# Patient Record
Sex: Female | Born: 1990
Health system: Southern US, Community
[De-identification: ages and names within clinical notes are randomized; demographics above are authoritative.]

## PROBLEM LIST (undated history)

## (undated) DIAGNOSIS — N62 Hypertrophy of breast: Secondary | ICD-10-CM

## (undated) HISTORY — PX: COLPOSCOPY: SHX161

## (undated) HISTORY — PX: NO PAST SURGERIES: SHX2092

---
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
## 2010-01-11 ENCOUNTER — Emergency Department (HOSPITAL_BASED_OUTPATIENT_CLINIC_OR_DEPARTMENT_OTHER): Admission: EM | Admit: 2010-01-11 | Discharge: 2009-09-16 | Payer: Self-pay | Admitting: Emergency Medicine

## 2010-02-11 ENCOUNTER — Emergency Department (HOSPITAL_BASED_OUTPATIENT_CLINIC_OR_DEPARTMENT_OTHER)
Admission: EM | Admit: 2010-02-11 | Discharge: 2010-02-11 | Payer: Self-pay | Source: Home / Self Care | Admitting: Emergency Medicine

## 2010-02-19 LAB — RAPID STREP SCREEN (MED CTR MEBANE ONLY): Streptococcus, Group A Screen (Direct): NEGATIVE

## 2010-12-08 ENCOUNTER — Encounter: Payer: Self-pay | Admitting: *Deleted

## 2010-12-08 ENCOUNTER — Emergency Department (HOSPITAL_BASED_OUTPATIENT_CLINIC_OR_DEPARTMENT_OTHER): Payer: Self-pay

## 2010-12-08 ENCOUNTER — Emergency Department (HOSPITAL_BASED_OUTPATIENT_CLINIC_OR_DEPARTMENT_OTHER)
Admission: EM | Admit: 2010-12-08 | Discharge: 2010-12-08 | Disposition: A | Discharge status: Home / Self Care | Payer: Self-pay | Attending: Emergency Medicine | Admitting: Emergency Medicine

## 2010-12-08 DIAGNOSIS — IMO0002 Reserved for concepts with insufficient information to code with codable children: Secondary | ICD-10-CM | POA: Insufficient documentation

## 2010-12-08 DIAGNOSIS — L02411 Cutaneous abscess of right axilla: Secondary | ICD-10-CM

## 2010-12-08 MED ORDER — HYDROCODONE-ACETAMINOPHEN 5-325 MG PO TABS
2.0000 | ORAL_TABLET | Freq: Once | ORAL | Status: AC
  Administered 2010-12-08: 2 via ORAL
  Filled 2010-12-08 (×2): qty 2

## 2010-12-08 MED ORDER — DOXYCYCLINE HYCLATE 100 MG PO CAPS: 100.0000 mg | ORAL_CAPSULE | Freq: Two times a day (BID) | ORAL | Status: AC

## 2010-12-08 MED ORDER — HYDROCODONE-ACETAMINOPHEN 5-325 MG PO TABS: 2.0000 | ORAL_TABLET | ORAL | Status: AC | PRN

## 2010-12-08 NOTE — ED Notes (Signed)
C/o boil under right arm x 2 days- states has started to drain

## 2010-12-12 NOTE — ED Provider Notes (Signed)
History     CSN: [id number] Arrival date & time: 12/08/2010  7:59 PM   First MD Initiated Contact with Patient 12/08/10 2008      Chief Complaint  Patient presents with  . Recurrent Skin Infections    (Consider location/radiation/quality/duration/timing/severity/associated sxs/prior treatment) Patient is a 20 y.o. female presenting with abscess. The history is provided by the patient. No language interpreter was used.  Abscess  This is a new problem. The current episode started more than one week ago. The onset was gradual. The problem occurs continuously. The problem has been gradually improving. The abscess is characterized by redness. The patient was exposed to OTC medications. The abscess first occurred at home.  Pt complains of a draining abscess.    History reviewed. No pertinent past medical history.  History reviewed. No pertinent past surgical history.  No family history on file.  History  Substance Use Topics  . Smoking status: Never Smoker   . Smokeless tobacco: Never Used  . Alcohol Use: Yes     occasional    OB History    Grav Para Term Preterm Abortions TAB SAB Ect Mult Living                  Review of Systems  Skin: Positive for wound.  All other systems reviewed and are negative.    Allergies  Review of patient's allergies indicates no known allergies.  Home Medications   Current Outpatient Rx  Name Route Sig Dispense Refill  . ACETAMINOPHEN 500 MG PO TABS Oral Take 1,000 mg by mouth once as needed. For pain     . IBUPROFEN 200 MG PO TABS Oral Take 200 mg by mouth once as needed. For pain     . DOXYCYCLINE HYCLATE 100 MG PO CAPS Oral Take 1 capsule (100 mg total) by mouth 2 (two) times daily. 20 capsule 0  . HYDROCODONE-ACETAMINOPHEN 5-325 MG PO TABS Oral Take 2 tablets by mouth every 4 (four) hours as needed for pain. 10 tablet 0    BP 93/65  Pulse 89  Temp(Src) 98.6 F (37 C) (Oral)  Resp 20  Ht 5\' 7"  (1.702 m)  Wt 170 lb (77.111 kg)   BMI 26.63 kg/m2  SpO2 100%  LMP 11/24/2010  Physical Exam  Nursing note and vitals reviewed. Constitutional: She is oriented to person, place, and time. She appears well-developed and well-nourished.  HENT:  Head: Normocephalic and atraumatic.  Right Ear: External ear normal.  Left Ear: External ear normal.  Mouth/Throat: Oropharynx is clear and moist.  Eyes: Conjunctivae and EOM are normal. Pupils are equal, round, and reactive to light.  Neck: Normal range of motion. Neck supple.  Cardiovascular: Normal rate and regular rhythm.   Pulmonary/Chest: Effort normal.  Musculoskeletal: Normal range of motion.  Neurological: She is alert and oriented to person, place, and time. She has normal reflexes.  Skin: There is erythema.       Draining abscess,    Psychiatric: She has a normal mood and affect.    ED Course  Procedures (including critical care time)  Labs Reviewed - No data to display No results found.   1. Abscess of axilla, right       MDM          Langston Masker, [place] 12/12/10 1859

## 2010-12-12 NOTE — ED Provider Notes (Signed)
Medical screening examination/treatment/procedure(s) were performed by non-physician practitioner and as supervising physician I was immediately available for consultation/collaboration.   Glynn Octave, MD 12/12/10 [phone]

## 2011-02-02 ENCOUNTER — Emergency Department (HOSPITAL_BASED_OUTPATIENT_CLINIC_OR_DEPARTMENT_OTHER)
Admission: EM | Admit: 2011-02-02 | Discharge: 2011-02-02 | Disposition: A | Discharge status: Home / Self Care | Payer: Self-pay | Attending: Emergency Medicine | Admitting: Emergency Medicine

## 2011-02-02 ENCOUNTER — Encounter (HOSPITAL_BASED_OUTPATIENT_CLINIC_OR_DEPARTMENT_OTHER): Payer: Self-pay | Admitting: *Deleted

## 2011-02-02 ENCOUNTER — Emergency Department (INDEPENDENT_AMBULATORY_CARE_PROVIDER_SITE_OTHER): Payer: Self-pay

## 2011-02-02 DIAGNOSIS — IMO0002 Reserved for concepts with insufficient information to code with codable children: Secondary | ICD-10-CM | POA: Insufficient documentation

## 2011-02-02 DIAGNOSIS — T148XXA Other injury of unspecified body region, initial encounter: Secondary | ICD-10-CM

## 2011-02-02 DIAGNOSIS — X58XXXA Exposure to other specified factors, initial encounter: Secondary | ICD-10-CM | POA: Insufficient documentation

## 2011-02-02 DIAGNOSIS — W19XXXA Unspecified fall, initial encounter: Secondary | ICD-10-CM

## 2011-02-02 DIAGNOSIS — M25569 Pain in unspecified knee: Secondary | ICD-10-CM

## 2011-02-02 LAB — PREGNANCY, URINE: Preg Test, Ur: NEGATIVE

## 2011-02-02 MED ORDER — HYDROCODONE-ACETAMINOPHEN 5-500 MG PO TABS: 1.0000 | ORAL_TABLET | Freq: Four times a day (QID) | ORAL | Status: AC | PRN

## 2011-02-02 NOTE — ED Provider Notes (Signed)
History     CSN: [id number]  Arrival date & time 02/02/11  1350   First MD Initiated Contact with Patient 02/02/11 1526      Chief Complaint  Patient presents with  . Knee Injury    (Consider location/radiation/quality/duration/timing/severity/associated sxs/prior treatment) Patient is a 20 y.o. female presenting with knee pain. The history is provided by the patient. No language interpreter was used.  Knee Pain This is a new problem. The current episode started in the past 7 days. The problem occurs constantly. The problem has been unchanged. The symptoms are aggravated by bending. She has tried nothing for the symptoms.    History reviewed. No pertinent past medical history.  History reviewed. No pertinent past surgical history.  No family history on file.  History  Substance Use Topics  . Smoking status: Never Smoker   . Smokeless tobacco: Never Used  . Alcohol Use: Yes     occasional    OB History    Grav Para Term Preterm Abortions TAB SAB Ect Mult Living                  Review of Systems  All other systems reviewed and are negative.    Allergies  Review of patient's allergies indicates no known allergies.  Home Medications   Current Outpatient Rx  Name Route Sig Dispense Refill  . ACETAMINOPHEN 500 MG PO TABS Oral Take 1,000 mg by mouth once as needed. For pain     . IBUPROFEN 200 MG PO TABS Oral Take 200 mg by mouth once as needed. For pain       BP 123/79  Pulse 85  Temp(Src) 98 F (36.7 C) (Oral)  Resp 18  Ht 5\' 7"  (1.702 m)  Wt 170 lb (77.111 kg)  BMI 26.63 kg/m2  SpO2 100%  LMP 01/19/2011  Physical Exam  Nursing note and vitals reviewed. Constitutional: She is oriented to person, place, and time. She appears well-developed and well-nourished.  HENT:  Head: Normocephalic and atraumatic.  Neck: Neck supple.  Cardiovascular: Normal rate and regular rhythm.   Pulmonary/Chest: Effort normal and breath sounds normal.  Musculoskeletal:  Normal range of motion.       Pt has full rom  Neurological: She is alert and oriented to person, place, and time. Coordination normal.  Skin:       Pt has an abrasion to the right knee    ED Course  Procedures (including critical care time)   Labs Reviewed  PREGNANCY, URINE   Dg Knee Complete 4 Views Right  02/02/2011  *RADIOLOGY REPORT*  Clinical Data: Right knee pain post fall  RIGHT KNEE - COMPLETE 4+ VIEW  Comparison: None.  Findings: Four views of the right knee submitted.  No acute fracture or subluxation.  No radiopaque foreign body.  IMPRESSION: No acute fracture or subluxation.  Original Report Authenticated By: Natasha Mead, M.D.     1. Knee pain   2. Abrasion       MDM  No acute findings:will treat symptomatically        Teressa Lower, NP 02/02/11 [phone]

## 2011-02-02 NOTE — ED Notes (Signed)
Pt states she slipped down a couple steps and injured her right knee. Abrasion to same.

## 2011-02-04 NOTE — ED Provider Notes (Signed)
Medical screening examination/treatment/procedure(s) were performed by non-physician practitioner and as supervising physician I was immediately available for consultation/collaboration. Needham Biggins Y.   Cranford Blessinger Y. Caven Perine, MD 02/04/11 [phone] 

## 2011-06-01 ENCOUNTER — Emergency Department (HOSPITAL_BASED_OUTPATIENT_CLINIC_OR_DEPARTMENT_OTHER)
Admission: EM | Admit: 2011-06-01 | Discharge: 2011-06-01 | Disposition: A | Discharge status: Home / Self Care | Payer: Self-pay | Attending: Emergency Medicine | Admitting: Emergency Medicine

## 2011-06-01 ENCOUNTER — Encounter (HOSPITAL_BASED_OUTPATIENT_CLINIC_OR_DEPARTMENT_OTHER): Payer: Self-pay | Admitting: Emergency Medicine

## 2011-06-01 ENCOUNTER — Emergency Department (INDEPENDENT_AMBULATORY_CARE_PROVIDER_SITE_OTHER): Payer: Self-pay

## 2011-06-01 DIAGNOSIS — IMO0002 Reserved for concepts with insufficient information to code with codable children: Secondary | ICD-10-CM | POA: Insufficient documentation

## 2011-06-01 DIAGNOSIS — R079 Chest pain, unspecified: Secondary | ICD-10-CM | POA: Insufficient documentation

## 2011-06-01 DIAGNOSIS — T148XXA Other injury of unspecified body region, initial encounter: Secondary | ICD-10-CM

## 2011-06-01 DIAGNOSIS — R0781 Pleurodynia: Secondary | ICD-10-CM

## 2011-06-01 MED ORDER — HYDROCODONE-ACETAMINOPHEN 5-500 MG PO TABS: 1.0000 | ORAL_TABLET | Freq: Four times a day (QID) | ORAL | Status: AC | PRN

## 2011-06-01 MED ORDER — HYDROCODONE-ACETAMINOPHEN 5-325 MG PO TABS
1.0000 | ORAL_TABLET | Freq: Once | ORAL | Status: AC
  Administered 2011-06-01: 1 via ORAL
  Filled 2011-06-01: qty 1

## 2011-06-01 NOTE — ED Provider Notes (Signed)
Medical screening examination/treatment/procedure(s) were performed by non-physician practitioner and as supervising physician I was immediately available for consultation/collaboration.  Doug Sou, MD 06/01/11 [phone]
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
## 2017-03-28 ENCOUNTER — Other Ambulatory Visit: Payer: Self-pay

## 2017-03-28 ENCOUNTER — Emergency Department (HOSPITAL_BASED_OUTPATIENT_CLINIC_OR_DEPARTMENT_OTHER): Payer: Self-pay

## 2017-03-28 ENCOUNTER — Encounter (HOSPITAL_BASED_OUTPATIENT_CLINIC_OR_DEPARTMENT_OTHER): Payer: Self-pay | Admitting: *Deleted

## 2017-03-28 ENCOUNTER — Emergency Department (HOSPITAL_BASED_OUTPATIENT_CLINIC_OR_DEPARTMENT_OTHER)
Admission: EM | Admit: 2017-03-28 | Discharge: 2017-03-28 | Disposition: A | Discharge status: Home / Self Care | Payer: Self-pay | Attending: Emergency Medicine | Admitting: Emergency Medicine

## 2017-03-28 DIAGNOSIS — H18891 Other specified disorders of cornea, right eye: Secondary | ICD-10-CM | POA: Insufficient documentation

## 2017-03-28 DIAGNOSIS — M791 Myalgia, unspecified site: Secondary | ICD-10-CM | POA: Insufficient documentation

## 2017-03-28 DIAGNOSIS — R51 Headache: Secondary | ICD-10-CM | POA: Insufficient documentation

## 2017-03-28 DIAGNOSIS — S62664A Nondisplaced fracture of distal phalanx of right ring finger, initial encounter for closed fracture: Secondary | ICD-10-CM | POA: Insufficient documentation

## 2017-03-28 DIAGNOSIS — Y9389 Activity, other specified: Secondary | ICD-10-CM | POA: Insufficient documentation

## 2017-03-28 DIAGNOSIS — Y998 Other external cause status: Secondary | ICD-10-CM | POA: Insufficient documentation

## 2017-03-28 DIAGNOSIS — Y929 Unspecified place or not applicable: Secondary | ICD-10-CM | POA: Insufficient documentation

## 2017-03-28 LAB — PREGNANCY, URINE: Preg Test, Ur: NEGATIVE

## 2017-03-28 MED ORDER — ERYTHROMYCIN 5 MG/GM OP OINT: TOPICAL_OINTMENT | OPHTHALMIC | 0 refills | Status: DC

## 2017-03-28 MED ORDER — CYCLOBENZAPRINE HCL 10 MG PO TABS: 10.0000 mg | ORAL_TABLET | Freq: Two times a day (BID) | ORAL | 0 refills | Status: DC | PRN

## 2017-03-28 MED ORDER — TETRACAINE HCL 0.5 % OP SOLN
2.0000 [drp] | Freq: Once | OPHTHALMIC | Status: AC
  Administered 2017-03-28: 2 [drp] via OPHTHALMIC
  Filled 2017-03-28: qty 4

## 2017-03-28 MED ORDER — FLUORESCEIN SODIUM 1 MG OP STRP
1.0000 | ORAL_STRIP | Freq: Once | OPHTHALMIC | Status: AC
  Administered 2017-03-28: 1 via OPHTHALMIC
  Filled 2017-03-28: qty 1

## 2017-03-28 MED ORDER — KETOROLAC TROMETHAMINE 60 MG/2ML IM SOLN
60.0000 mg | Freq: Once | INTRAMUSCULAR | Status: AC
  Administered 2017-03-28: 60 mg via INTRAMUSCULAR
  Filled 2017-03-28: qty 2

## 2017-03-28 NOTE — ED Notes (Signed)
ED Provider at bedside. 

## 2017-03-28 NOTE — ED Notes (Signed)
Patient transported to X-ray 

## 2017-03-28 NOTE — Discharge Instructions (Addendum)
Wear your finger splint for 3-4 weeks, recommend ice and elevation

## 2017-03-28 NOTE — ED Triage Notes (Signed)
Generalized body aches x 2 days
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
## 2019-02-22 ENCOUNTER — Other Ambulatory Visit: Payer: Self-pay

## 2019-02-22 ENCOUNTER — Emergency Department (HOSPITAL_BASED_OUTPATIENT_CLINIC_OR_DEPARTMENT_OTHER): Admission: EM | Admit: 2019-02-22 | Discharge: 2019-02-22 | Discharge status: Against Medical Advice | Payer: Self-pay

## 2019-03-17 MED FILL — PREVIDENT 5000 SENSITIVE PA: 1.1-5 | 30 days supply | Qty: 100 | Fill #0

## 2020-07-10 ENCOUNTER — Other Ambulatory Visit (HOSPITAL_BASED_OUTPATIENT_CLINIC_OR_DEPARTMENT_OTHER): Payer: Self-pay

## 2020-08-14 ENCOUNTER — Other Ambulatory Visit (HOSPITAL_BASED_OUTPATIENT_CLINIC_OR_DEPARTMENT_OTHER): Payer: Self-pay

## 2020-08-14 MED ORDER — PHENTERMINE HCL 15 MG PO CAPS
ORAL_CAPSULE | ORAL | 0 refills | Status: DC
  Filled 2020-08-14: qty 30, 30d supply, fill #0

## 2021-04-29 ENCOUNTER — Emergency Department (HOSPITAL_BASED_OUTPATIENT_CLINIC_OR_DEPARTMENT_OTHER): Payer: Self-pay

## 2021-04-29 ENCOUNTER — Emergency Department (HOSPITAL_BASED_OUTPATIENT_CLINIC_OR_DEPARTMENT_OTHER)
Admission: EM | Admit: 2021-04-29 | Discharge: 2021-04-29 | Disposition: A | Discharge status: Home / Self Care | Payer: Self-pay | Attending: Emergency Medicine | Admitting: Emergency Medicine

## 2021-04-29 ENCOUNTER — Other Ambulatory Visit: Payer: Self-pay

## 2021-04-29 DIAGNOSIS — R109 Unspecified abdominal pain: Secondary | ICD-10-CM | POA: Insufficient documentation

## 2021-04-29 DIAGNOSIS — R112 Nausea with vomiting, unspecified: Secondary | ICD-10-CM | POA: Insufficient documentation

## 2021-04-29 DIAGNOSIS — D72829 Elevated white blood cell count, unspecified: Secondary | ICD-10-CM | POA: Insufficient documentation

## 2021-04-29 LAB — TROPONIN I (HIGH SENSITIVITY)
Troponin I (High Sensitivity): <2 ng/L (ref <18)
Troponin I (High Sensitivity): <2 ng/L (ref <18)

## 2021-04-29 LAB — COMPREHENSIVE METABOLIC PANEL
ALT: 15 U/L (ref 0–44)
AST: 23 U/L (ref 15–41)
Albumin: 4.3 g/dL (ref 3.5–5.0)
Alkaline Phosphatase: 36 U/L — ABNORMAL LOW (ref 38–126)
Anion gap: 6 (ref 5–15)
BUN: 11 mg/dL (ref 6–20)
CO2: 23 mmol/L (ref 22–32)
Calcium: 9.2 mg/dL (ref 8.9–10.3)
Chloride: 109 mmol/L (ref 98–111)
Creatinine, Ser: 0.76 mg/dL (ref 0.44–1.00)
GFR, Estimated: >60 mL/min (ref >60)
Glucose, Bld: 113 mg/dL — ABNORMAL HIGH (ref 70–99)
Potassium: 3.9 mmol/L (ref 3.5–5.1)
Sodium: 138 mmol/L (ref 135–145)
Total Bilirubin: 0.4 mg/dL (ref 0.3–1.2)
Total Protein: 7.7 g/dL (ref 6.5–8.1)

## 2021-04-29 LAB — CBC
HCT: 38.2 % (ref 36.0–46.0)
Hemoglobin: 13.4 g/dL (ref 12.0–15.0)
MCH: 32.6 pg (ref 26.0–34.0)
MCHC: 35.1 g/dL (ref 30.0–36.0)
MCV: 92.9 fL (ref 80.0–100.0)
Platelets: 214 10*3/uL (ref 150–400)
RBC: 4.11 MIL/uL (ref 3.87–5.11)
RDW: 11.9 % (ref 11.5–15.5)
WBC: 11.9 10*3/uL — ABNORMAL HIGH (ref 4.0–10.5)
nRBC: 0 % (ref 0.0–0.2)

## 2021-04-29 LAB — URINALYSIS, ROUTINE W REFLEX MICROSCOPIC
Bilirubin Urine: NEGATIVE
Glucose, UA: NEGATIVE mg/dL
Ketones, ur: NEGATIVE mg/dL
Leukocytes,Ua: NEGATIVE
Nitrite: NEGATIVE
Protein, ur: NEGATIVE mg/dL
Specific Gravity, Urine: >=1.03 (ref 1.005–1.030)
pH: 5.5 (ref 5.0–8.0)

## 2021-04-29 LAB — URINALYSIS, MICROSCOPIC (REFLEX)

## 2021-04-29 LAB — PREGNANCY, URINE: Preg Test, Ur: NEGATIVE

## 2021-04-29 LAB — LIPASE, BLOOD: Lipase: 24 U/L (ref 11–51)

## 2021-04-29 MED ORDER — MORPHINE SULFATE (PF) 4 MG/ML IV SOLN
4.0000 mg | Freq: Once | INTRAVENOUS | Status: AC
  Administered 2021-04-29: 4 mg via INTRAVENOUS
  Filled 2021-04-29: qty 1

## 2021-04-29 NOTE — Discharge Instructions (Signed)
The test today in the ED were reassuring.  No signs of acute infection.  No signs of heart problems.  No signs to suggest urinary tract infection or gallstones.  Monitor for signs of fever vomiting or other concerning symptoms.  Return as needed ?

## 2021-04-29 NOTE — ED Provider Notes (Signed)
?[facility] EMERGENCY DEPARTMENT ?Provider Note ? ? ?CSN: [id number] ?Arrival date & time: 04/29/21  [id number] ? ?  ? ?History ? ?Chief Complaint  ?Patient presents with  ? Back Pain  ? ? ?Madison Moss is a 31 y.o. female. ? ? ?Back Pain ? ? Pt complains of bilateral flank pain that started this morning.  Pain is sharp below bilateral breasts and goes to the flank.    Did have nausea and vomiting with the pain.  Nothing seems to make the pain better or worse.  No cough.  No abdominal pain.  No dysuria.  No history of similar symptoms in the past. ? ?Home Medications ?Prior to Admission medications   ?Medication Sig Start Date End Date Taking? Authorizing Provider  ?acetaminophen (TYLENOL) 500 MG tablet Take 1,000 mg by mouth once as needed. For pain     [provider]  ?cyclobenzaprine (FLEXERIL) 10 MG tablet Take 1 tablet (10 mg total) by mouth 2 (two) times daily as needed for muscle spasms. 03/28/17   Alvira MondaySchlossman, Erin, MD  ?erythromycin ophthalmic ointment Place a 1/2 inch ribbon of ointment into the lower eyelid four times daily until symptoms resolve or for one week. 03/28/17   Alvira MondaySchlossman, Erin, MD  ?ibuprofen (ADVIL,MOTRIN) 200 MG tablet Take 200 mg by mouth once as needed. For pain     [provider]  ?phentermine 15 MG capsule Take 1 (one) Capsule by mouth daily as directed 08/14/20     ?   ? ?Allergies    ?Patient has no known allergies.   ? ?Review of Systems   ?Review of Systems  ?Musculoskeletal:  Positive for back pain.  ?All other systems reviewed and are negative. ? ?Physical Exam ?Updated Vital Signs ?BP 116/82   Pulse 72   Temp 98.3 ?F (36.8 ?C)   Resp 18   LMP 04/04/2021   SpO2 100%  ?Physical Exam ?Vitals and nursing note reviewed.  ?Constitutional:   ?   General: She is not in acute distress. ?   Appearance: She is well-developed.  ?HENT:  ?   Head: Normocephalic and atraumatic.  ?   Right Ear: External ear normal.  ?   Left Ear: External ear normal.  ?Eyes:  ?   General:  No scleral icterus.    ?   Right eye: No discharge.     ?   Left eye: No discharge.  ?   Conjunctiva/sclera: Conjunctivae normal.  ?Neck:  ?   Trachea: No tracheal deviation.  ?Cardiovascular:  ?   Rate and Rhythm: Normal rate and regular rhythm.  ?Pulmonary:  ?   Effort: Pulmonary effort is normal. No respiratory distress.  ?   Breath sounds: Normal breath sounds. No stridor. No wheezing or rales.  ?Abdominal:  ?   General: Bowel sounds are normal. There is no distension.  ?   Palpations: Abdomen is soft.  ?   Tenderness: There is no abdominal tenderness. There is no right CVA tenderness, left CVA tenderness, guarding or rebound.  ?Musculoskeletal:     ?   General: No tenderness or deformity.  ?   Cervical back: Neck supple.  ?Skin: ?   General: Skin is warm and dry.  ?   Findings: No rash.  ?Neurological:  ?   General: No focal deficit present.  ?   Mental Status: She is alert.  ?   Cranial Nerves: No cranial nerve deficit (no facial droop, extraocular movements intact, no slurred speech).  ?  Sensory: No sensory deficit.  ?   Motor: No abnormal muscle tone or seizure activity.  ?   Coordination: Coordination normal.  ?Psychiatric:     ?   Mood and Affect: Mood normal.  ? ? ?ED Results / Procedures / Treatments   ?Labs ?(all labs ordered are listed, but only abnormal results are displayed) ?Labs Reviewed  ?URINALYSIS, ROUTINE W REFLEX MICROSCOPIC - Abnormal; Notable for the following components:  ?    Result Value  ? Hgb urine dipstick TRACE (*)   ? All other components within normal limits  ?CBC - Abnormal; Notable for the following components:  ? WBC 11.9 (*)   ? All other components within normal limits  ?COMPREHENSIVE METABOLIC PANEL - Abnormal; Notable for the following components:  ? Glucose, Bld 113 (*)   ? Alkaline Phosphatase 36 (*)   ? All other components within normal limits  ?URINALYSIS, MICROSCOPIC (REFLEX) - Abnormal; Notable for the following components:  ? Bacteria, UA MANY (*)   ? All other  components within normal limits  ?PREGNANCY, URINE  ?LIPASE, BLOOD  ?TROPONIN I (HIGH SENSITIVITY)  ?TROPONIN I (HIGH SENSITIVITY)  ? ? ?EKG ?None ? ?Radiology ?DG Chest 2 View ? ?Result Date: 04/29/2021 ?CLINICAL DATA:  Bilateral flank pain EXAM: CHEST - 2 VIEW COMPARISON:  06/01/2011 FINDINGS: Normal heart size and mediastinal contours. No acute infiltrate or edema. No effusion or pneumothorax. No acute osseous findings. IMPRESSION: Negative chest. Electronically Signed   By: Tiburcio Pea M.D.   On: 04/29/2021 08:02  ? ?US Abdomen Limited RUQ (LIVER/GB) ? ?Result Date: 04/29/2021 ?CLINICAL DATA:  Flank pain EXAM: ULTRASOUND ABDOMEN LIMITED RIGHT UPPER QUADRANT COMPARISON:  None. FINDINGS: Gallbladder: No gallstones or wall thickening visualized. No sonographic Murphy sign noted by sonographer. Common bile duct: Diameter: 6 mm proximally Liver: No focal lesion identified. Within normal limits in parenchymal echogenicity. Portal vein is patent on color Doppler imaging with normal direction of blood flow towards the liver. Other: None. IMPRESSION: No definite acute abnormality identified. Electronically Signed   By: Jannifer Hick M.D.   On: 04/29/2021 11:12   ? ?Procedures ?Procedures  ? ? ?Medications Ordered in ED ?Medications  ?morphine (PF) 4 MG/ML injection 4 mg (4 mg Intravenous Given 04/29/21 0730)  ? ? ?ED Course/ Medical Decision Making/ A&P ?Clinical Course as of 04/30/21 0709  ?Wynelle Link Apr 29, 2021  ?0717 Urinalysis, Routine w reflex microscopic Urine, Clean Catch(!) [JK]  ?6195 Many bacteria but negative nitrite and leukocyte esterase.  Not definitive for infection [JK]  ?0720 Pregnancy, urine ?Get his [JK]  ?0932 DG Chest 2 View ?Chest x-ray images and radiology report reviewed.  No acute findings [JK]  ?6712 CBC(!) ?White blood cell count slightly increased [JK]  ?4580 Comprehensive metabolic panel(!) ?Normal [JK]  ?9983 Lipase, blood ?Normal [JK]  ?1124 US Abdomen Limited RUQ (LIVER/GB) ?Ultrasound  reviewed.  No acute findings [JK]  ?  ?Clinical Course User Index ?[JK] Linwood Dibbles, MD  ? ?                        ?Medical Decision Making ?Amount and/or Complexity of Data Reviewed ?Labs: ordered. Decision-making details documented in ED Course. ?Radiology: ordered. Decision-making details documented in ED Course. ? ?Risk ?Prescription drug management. ? ? ?Patient presented with complaints of flank pain.  Symptoms concerning for the possibility of biliary colic, ureteral colic, atypical for ACS cardiac etiology. ? ?ED work-up reassuring.  No evidence of pneumonia pneumothorax.  Findings not suggestive of hepatitis or pancreatitis.  Doubt acute cardiac etiology.  Ultrasound performed and no evidence of cholecystitis or cholelithiasis.  Patient states she passed flatus while she is here and symptoms subsequently resolved. ? ?Evaluation and diagnostic testing in the emergency department does not suggest an emergent condition requiring admission or immediate intervention beyond what has been performed at this time.  The patient is safe for discharge and has been instructed to return immediately for worsening symptoms, change in symptoms or any other concerns. ? ? ? ? ? ? ? ? ? ?Final Clinical Impression(s) / ED Diagnoses ?Final diagnoses:  ?Flank pain  ? ? ?Rx / DC Orders ?ED Discharge Orders   ? ? None  ? ?  ? ? ?  ?Linwood Dibbles, MD ?04/30/21 [phone] ? ?

## 2021-04-29 NOTE — ED Triage Notes (Addendum)
Pt reports mid back/ flank pain radiating across ribs and epigastric area. She says it feels like gas pain. Did have episode of nausea and vomiting earlier. Nothing makes pain worse or better. She says constant. ?

## 2022-07-26 IMAGING — US US ABDOMEN LIMITED
1 series · 14 of 25 positions shown · non-contrast
Comparison: None.

CLINICAL DATA: Flank pain

EXAM:
ULTRASOUND ABDOMEN LIMITED RIGHT UPPER QUADRANT

[Series 1: us abdomen limited · 14 of 73 slices shown]
[im 1/73]
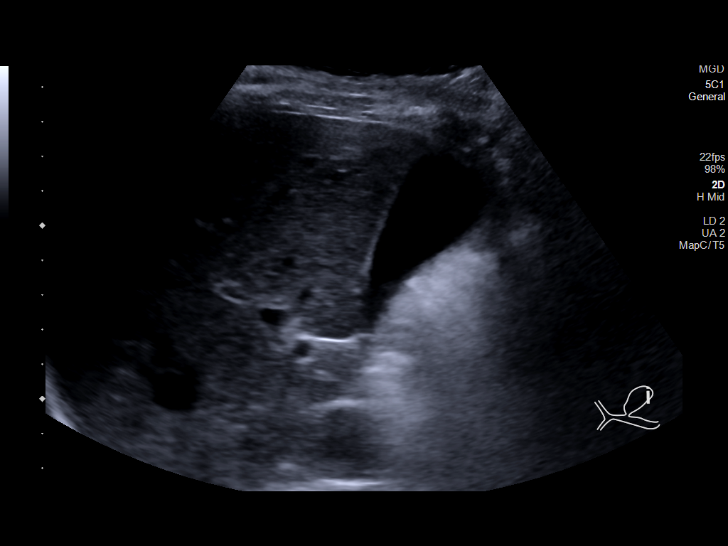
[im 7/73]
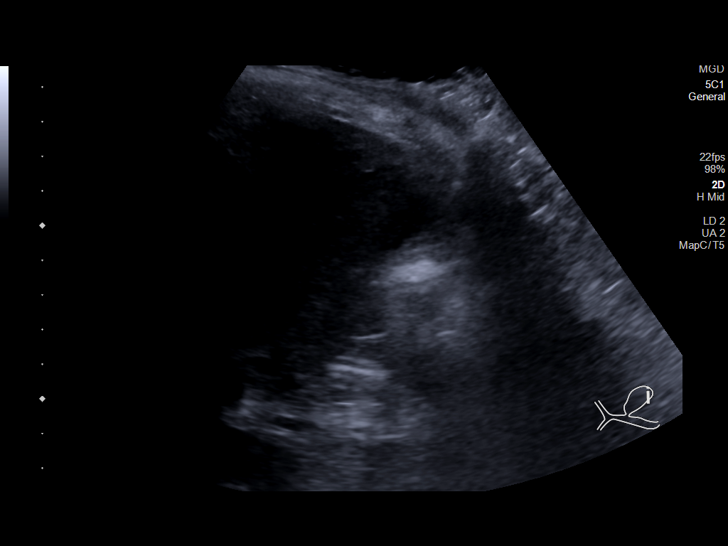
[im 13/73]
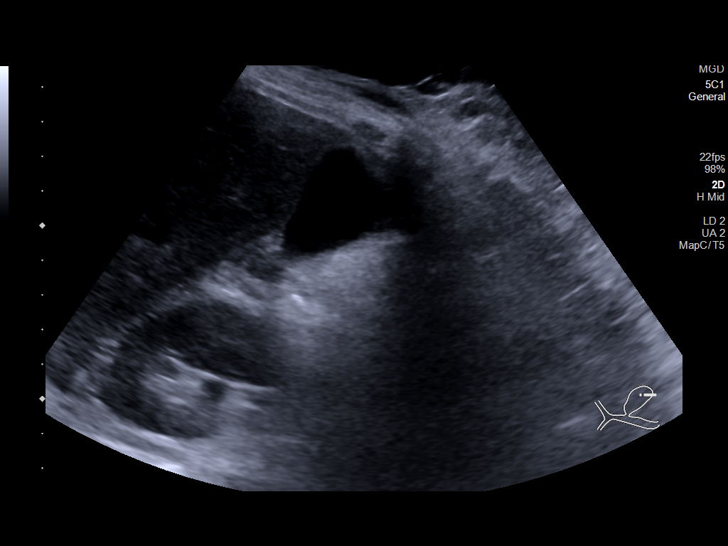
[im 19/73]
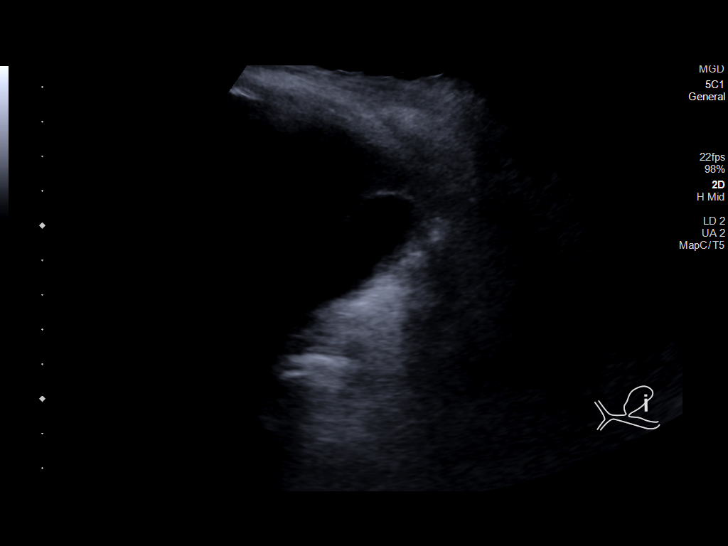
[im 25/73]
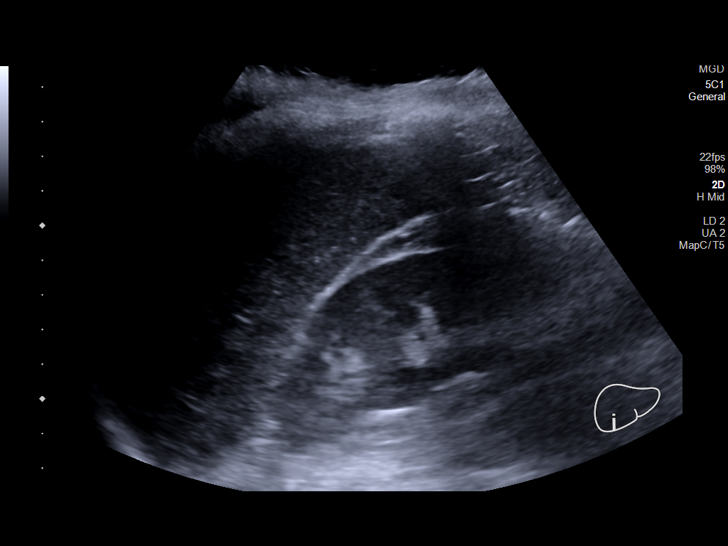
[im 28/73]
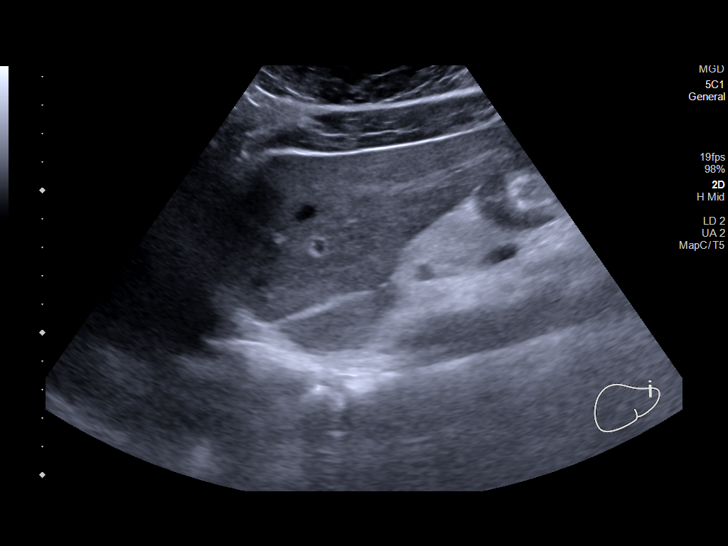
[im 34/73]
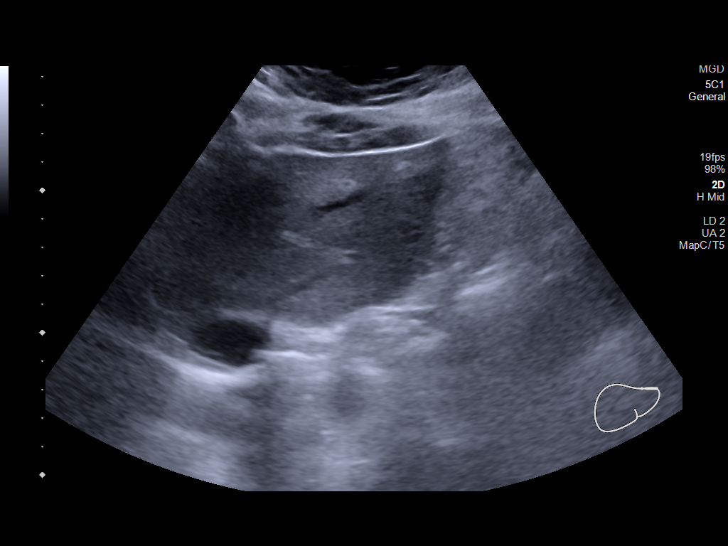
[im 40/73]
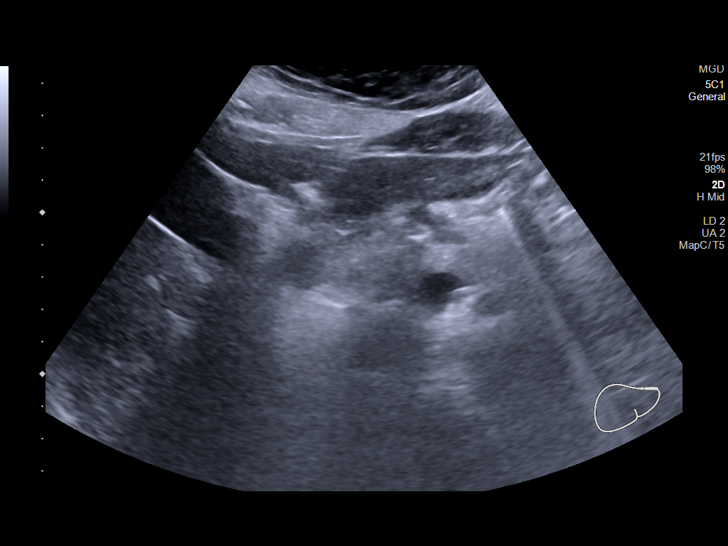
[im 46/73]
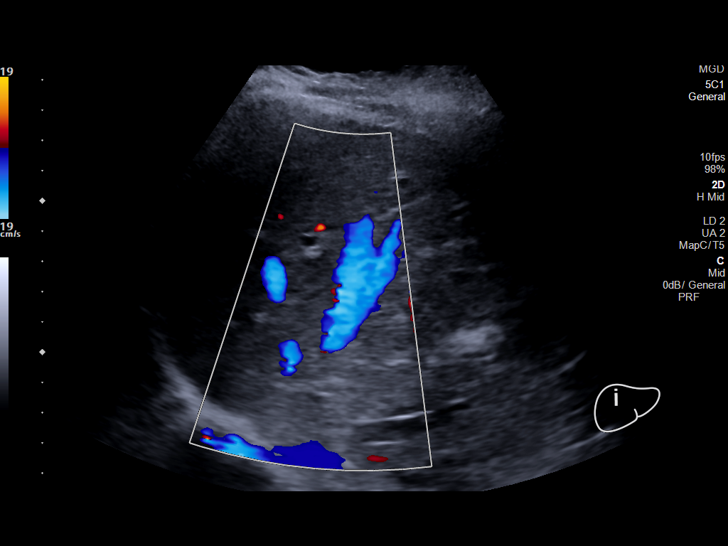
[im 49/73]
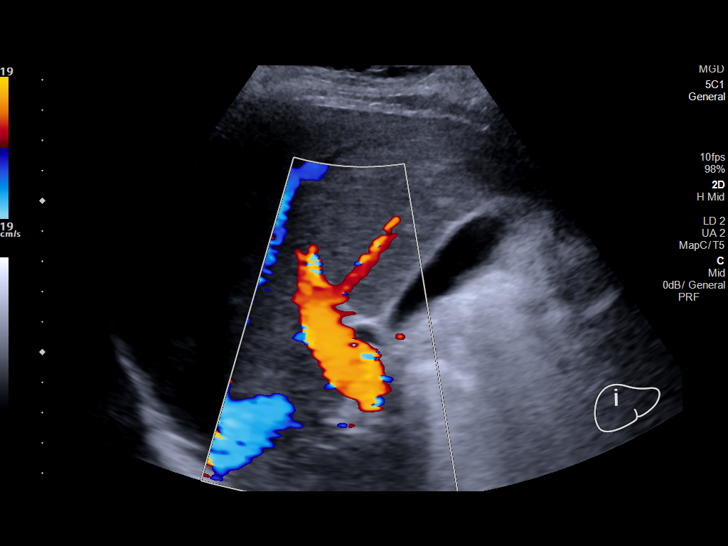
[im 55/73]
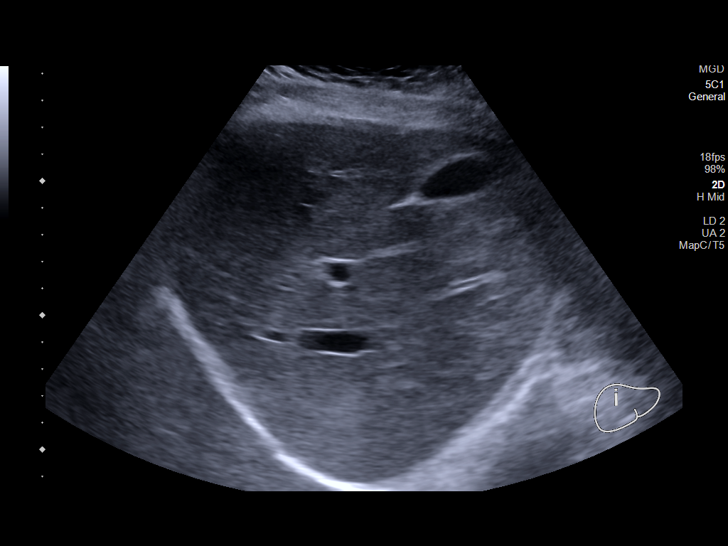
[im 61/73]
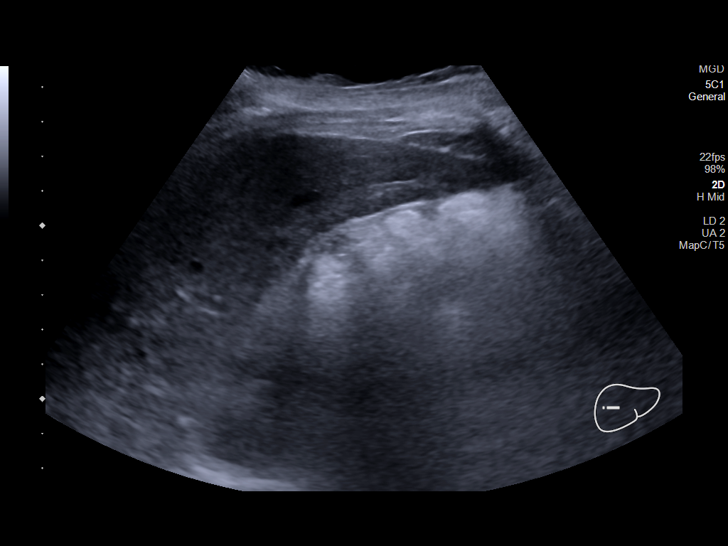
[im 67/73]
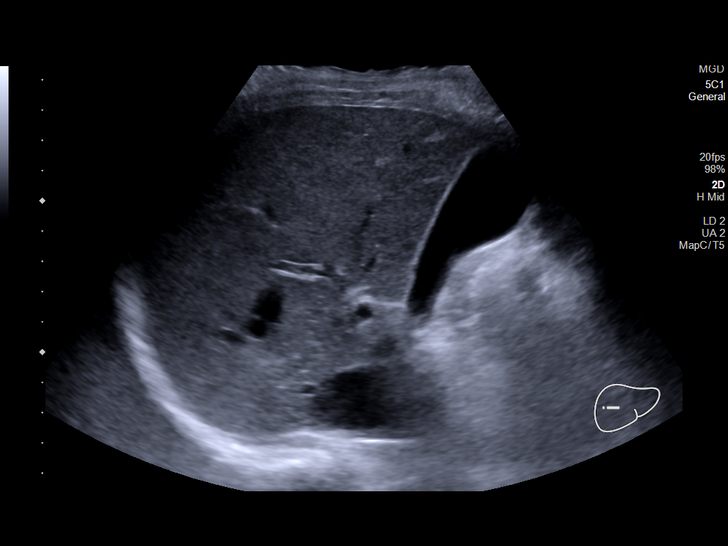
[im 73/73]
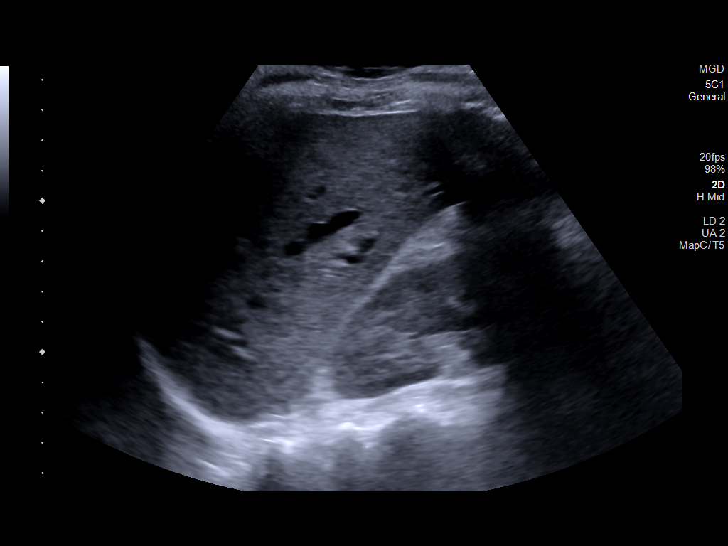

[14 of 25 positions shown; findings below may reference images not displayed]

FINDINGS: Gallbladder:

No gallstones or wall thickening visualized. No sonographic Murphy
sign noted by sonographer.

Common bile duct:

Diameter: 6 mm proximally

Liver:

No focal lesion identified. Within normal limits in parenchymal
echogenicity. Portal vein is patent on color Doppler imaging with
normal direction of blood flow towards the liver.

Other: None.
IMPRESSION: No definite acute abnormality identified.

## 2022-12-24 ENCOUNTER — Other Ambulatory Visit: Payer: Self-pay

## 2022-12-24 ENCOUNTER — Encounter (HOSPITAL_BASED_OUTPATIENT_CLINIC_OR_DEPARTMENT_OTHER): Payer: Self-pay | Admitting: Plastic Surgery

## 2022-12-25 NOTE — H&P (Signed)
  Subjective Patient ID: Madison Moss is a 32 y.o. female.     HPI   Presents for consultation breast reduction. Last seen 09/2020. Current 36 DDD. Reports several year history neck and back pain. This has not improved with specialty fitted bras, OTC pain medication, hot/cold packs, regular activity/exercise, chiropractor treatments for over 1 year trial. Reports rashes beneath breasts every 2-3 months. Attempts to control with hygiene measures, powders. This has not been effective over 1 year trial. Denies numbness hands. Wt- up 12 lb since 2022 visit here. Has participated in monitored weight loss program through [facility] including diet modification, exercise, discussion with practitioner for over 3 month duration.   No prior MMG. Denies family history breast or ovarian ca.   Two kids ages 28 and 67. States "done" having kids. Mother with assist with post operative care.   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      Review of Systems  Musculoskeletal:  Positive for back pain and neck pain.  Skin:  Positive for rash.  All other systems reviewed and are negative.         Objective Physical Exam  Cardiovascular: Normal rate, regular rhythm and normal heart sounds.    Pulmonary/Chest Effort normal and breath sounds normal.    Skin   Fitzpatrick 5       Lymph: no palpable axillary adenopathy   +shoulder grooving Breasts: no palpable masses, grade 3 ptosis bilateral SN to nipple R 37 L 35 cm BW R 26 L 23 cm Nipple to IMF R 18 L 15 cm       Assessment/Plan Macromastia Chronic neck and back pain Intertrigo   At her age, and in absence of family history, do not require MMG prior to surgery.   Chronic neck and back and shoulder pain intertrigo in setting of macromastia that has failed conservative management including monitored weight loss program for over 3 month duration. There are no other physical findings that could cause symptoms.  There is a reasonable likelihood that the patient's symptoms are primarily due to macromastia. Breast reduction is likely to result in improvement of the symptoms.   Reviewed reduction with anchor type scars, OP surgery, drains, post operative visits and limitations, recovery. Diminished sensation nipple and breast skin, risk of nipple loss, wound healing problems, asymmetry, incidental carcinoma, changes with wt gain/loss, aging, unacceptable cosmetic appearance reviewed. Reviewed changes with pregnancy, effect on breast feeding. Counseled cannot assure her cup size. Reviewed any NAC compromise would prevent her from breast feed in future.    Additional risks including but not limited to bleeding hematoma seroma infection damage to adjacent structures need for additional procedures blood clots in legs or lungs reviewed. Completed ASPS consent for breast reduction.   Drain teaching completed. Rx for tramadol given.    Anticipate 713 g reduction from each breast    Glenna Fellows, MD [facility]astic [facility]  Office/ physician access line after hours [phone]

## 2022-12-30 MED ORDER — CHLORHEXIDINE GLUCONATE CLOTH 2 % EX PADS: 6.0000 | MEDICATED_PAD | Freq: Once | CUTANEOUS | Status: DC

## 2022-12-30 NOTE — Progress Notes (Signed)

## 2022-12-31 ENCOUNTER — Encounter (HOSPITAL_BASED_OUTPATIENT_CLINIC_OR_DEPARTMENT_OTHER): Payer: Self-pay | Admitting: Plastic Surgery

## 2022-12-31 ENCOUNTER — Ambulatory Visit (HOSPITAL_BASED_OUTPATIENT_CLINIC_OR_DEPARTMENT_OTHER): Payer: BC Managed Care – PPO | Admitting: Anesthesiology

## 2022-12-31 ENCOUNTER — Ambulatory Visit (HOSPITAL_BASED_OUTPATIENT_CLINIC_OR_DEPARTMENT_OTHER)
Admission: RE | Admit: 2022-12-31 | Discharge: 2022-12-31 | Disposition: A | Discharge status: Home / Self Care | Payer: BC Managed Care – PPO | Attending: Plastic Surgery | Admitting: Plastic Surgery

## 2022-12-31 ENCOUNTER — Encounter (HOSPITAL_BASED_OUTPATIENT_CLINIC_OR_DEPARTMENT_OTHER)
Admission: RE | Disposition: A | Discharge status: Home / Self Care | Payer: Self-pay | Source: Home / Self Care | Attending: Plastic Surgery

## 2022-12-31 ENCOUNTER — Other Ambulatory Visit: Payer: Self-pay

## 2022-12-31 DIAGNOSIS — N6022 Fibroadenosis of left breast: Secondary | ICD-10-CM | POA: Insufficient documentation

## 2022-12-31 DIAGNOSIS — N62 Hypertrophy of breast: Secondary | ICD-10-CM | POA: Diagnosis present

## 2022-12-31 DIAGNOSIS — M549 Dorsalgia, unspecified: Secondary | ICD-10-CM | POA: Insufficient documentation

## 2022-12-31 DIAGNOSIS — N6021 Fibroadenosis of right breast: Secondary | ICD-10-CM | POA: Insufficient documentation

## 2022-12-31 DIAGNOSIS — G8929 Other chronic pain: Secondary | ICD-10-CM | POA: Diagnosis not present

## 2022-12-31 DIAGNOSIS — Z01818 Encounter for other preprocedural examination: Secondary | ICD-10-CM

## 2022-12-31 DIAGNOSIS — M542 Cervicalgia: Secondary | ICD-10-CM | POA: Diagnosis not present

## 2022-12-31 DIAGNOSIS — L304 Erythema intertrigo: Secondary | ICD-10-CM | POA: Insufficient documentation

## 2022-12-31 HISTORY — PX: BREAST REDUCTION SURGERY: SHX8

## 2022-12-31 HISTORY — DX: Hypertrophy of breast: N62

## 2022-12-31 LAB — POCT PREGNANCY, URINE: Preg Test, Ur: NEGATIVE

## 2022-12-31 SURGERY — MAMMOPLASTY, REDUCTION
Anesthesia: General | Site: Breast | Laterality: Bilateral

## 2022-12-31 MED ORDER — OXYCODONE HCL 5 MG PO TABS
5.0000 mg | ORAL_TABLET | Freq: Once | ORAL | Status: AC
  Administered 2022-12-31: 5 mg via ORAL

## 2022-12-31 MED ORDER — LIDOCAINE HCL (CARDIAC) PF 100 MG/5ML IV SOSY
PREFILLED_SYRINGE | INTRAVENOUS | Status: DC | PRN
  Administered 2022-12-31: 60 mg via INTRAVENOUS

## 2022-12-31 MED ORDER — ACETAMINOPHEN 500 MG PO TABS
1000.0000 mg | ORAL_TABLET | ORAL | Status: AC
  Administered 2022-12-31: 1000 mg via ORAL

## 2022-12-31 MED ORDER — PROPOFOL 1000 MG/100ML IV EMUL
INTRAVENOUS | Status: AC
Start: 2022-12-31 — End: ?
  Filled 2022-12-31: qty 400

## 2022-12-31 MED ORDER — DEXAMETHASONE SODIUM PHOSPHATE 10 MG/ML IJ SOLN
INTRAMUSCULAR | Status: AC
Start: 2022-12-31 — End: ?
  Filled 2022-12-31: qty 2

## 2022-12-31 MED ORDER — GABAPENTIN 300 MG PO CAPS
ORAL_CAPSULE | ORAL | Status: AC
  Filled 2022-12-31: qty 1

## 2022-12-31 MED ORDER — HYDROMORPHONE HCL 1 MG/ML IJ SOLN
INTRAMUSCULAR | Status: AC
  Filled 2022-12-31: qty 0.5

## 2022-12-31 MED ORDER — GABAPENTIN 300 MG PO CAPS
300.0000 mg | ORAL_CAPSULE | ORAL | Status: AC
  Administered 2022-12-31: 300 mg via ORAL

## 2022-12-31 MED ORDER — ONDANSETRON HCL 4 MG/2ML IJ SOLN
INTRAMUSCULAR | Status: DC | PRN
Start: 2022-12-31 — End: 2022-12-31
  Administered 2022-12-31: 4 mg via INTRAVENOUS

## 2022-12-31 MED ORDER — OXYCODONE HCL 5 MG PO TABS
ORAL_TABLET | ORAL | Status: AC
  Filled 2022-12-31: qty 1

## 2022-12-31 MED ORDER — BUPIVACAINE HCL (PF) 0.25 % IJ SOLN
INTRAMUSCULAR | Status: DC | PRN
  Administered 2022-12-31: 30 mL

## 2022-12-31 MED ORDER — CELECOXIB 200 MG PO CAPS
ORAL_CAPSULE | ORAL | Status: AC
  Filled 2022-12-31: qty 1

## 2022-12-31 MED ORDER — CEFAZOLIN SODIUM-DEXTROSE 2-4 GM/100ML-% IV SOLN
2.0000 g | INTRAVENOUS | Status: AC
  Administered 2022-12-31: 2 g via INTRAVENOUS

## 2022-12-31 MED ORDER — FENTANYL CITRATE (PF) 100 MCG/2ML IJ SOLN
INTRAMUSCULAR | Status: AC
  Filled 2022-12-31: qty 2

## 2022-12-31 MED ORDER — LACTATED RINGERS IV SOLN: INTRAVENOUS | Status: DC

## 2022-12-31 MED ORDER — MIDAZOLAM HCL 2 MG/2ML IJ SOLN
INTRAMUSCULAR | Status: DC | PRN
  Administered 2022-12-31: 2 mg via INTRAVENOUS

## 2022-12-31 MED ORDER — PROPOFOL 500 MG/50ML IV EMUL
INTRAVENOUS | Status: DC | PRN
  Administered 2022-12-31: 200 ug/kg/min via INTRAVENOUS

## 2022-12-31 MED ORDER — FENTANYL CITRATE (PF) 100 MCG/2ML IJ SOLN
INTRAMUSCULAR | Status: DC | PRN
  Administered 2022-12-31: 25 ug via INTRAVENOUS
  Administered 2022-12-31: 100 ug via INTRAVENOUS
  Administered 2022-12-31: 25 ug via INTRAVENOUS

## 2022-12-31 MED ORDER — ROCURONIUM BROMIDE 100 MG/10ML IV SOLN
INTRAVENOUS | Status: DC | PRN
  Administered 2022-12-31 (×2): 10 mg via INTRAVENOUS
  Administered 2022-12-31: 70 mg via INTRAVENOUS

## 2022-12-31 MED ORDER — PROPOFOL 10 MG/ML IV BOLUS
INTRAVENOUS | Status: AC
  Filled 2022-12-31: qty 20

## 2022-12-31 MED ORDER — HYDROMORPHONE HCL 1 MG/ML IJ SOLN
0.2500 mg | INTRAMUSCULAR | Status: DC | PRN
Start: 2022-12-31 — End: 2022-12-31
  Administered 2022-12-31 (×2): 0.5 mg via INTRAVENOUS

## 2022-12-31 MED ORDER — DEXAMETHASONE SODIUM PHOSPHATE 4 MG/ML IJ SOLN
INTRAMUSCULAR | Status: DC | PRN
  Administered 2022-12-31: 8 mg via INTRAVENOUS

## 2022-12-31 MED ORDER — CELECOXIB 200 MG PO CAPS
200.0000 mg | ORAL_CAPSULE | ORAL | Status: AC
  Administered 2022-12-31: 200 mg via ORAL

## 2022-12-31 MED ORDER — CEFAZOLIN SODIUM-DEXTROSE 2-4 GM/100ML-% IV SOLN
INTRAVENOUS | Status: AC
  Filled 2022-12-31: qty 100

## 2022-12-31 MED ORDER — PROPOFOL 10 MG/ML IV BOLUS
INTRAVENOUS | Status: DC | PRN
  Administered 2022-12-31: 200 mg via INTRAVENOUS

## 2022-12-31 MED ORDER — DEXMEDETOMIDINE HCL IN NACL 80 MCG/20ML IV SOLN
INTRAVENOUS | Status: DC | PRN
  Administered 2022-12-31: 4 ug via INTRAVENOUS
  Administered 2022-12-31: 8 ug via INTRAVENOUS

## 2022-12-31 MED ORDER — MIDAZOLAM HCL 2 MG/2ML IJ SOLN
INTRAMUSCULAR | Status: AC
  Filled 2022-12-31: qty 2

## 2022-12-31 MED ORDER — SUGAMMADEX SODIUM 200 MG/2ML IV SOLN
INTRAVENOUS | Status: DC | PRN
  Administered 2022-12-31: 200 mg via INTRAVENOUS

## 2022-12-31 MED ORDER — 0.9 % SODIUM CHLORIDE (POUR BTL) OPTIME
TOPICAL | Status: DC | PRN
  Administered 2022-12-31: 300 mL

## 2022-12-31 MED ORDER — ACETAMINOPHEN 500 MG PO TABS
ORAL_TABLET | ORAL | Status: AC
  Filled 2022-12-31: qty 2

## 2022-12-31 MED ORDER — ONDANSETRON HCL 4 MG/2ML IJ SOLN
INTRAMUSCULAR | Status: AC
  Filled 2022-12-31: qty 4

## 2022-12-31 SURGICAL SUPPLY — 42 items
BINDER BREAST XLRG (GAUZE/BANDAGES/DRESSINGS) IMPLANT
BINDER BREAST XXLRG (GAUZE/BANDAGES/DRESSINGS) IMPLANT
BLADE SURG 10 STRL SS (BLADE) ×4 IMPLANT
BNDG GAUZE DERMACEA FLUFF 4 (GAUZE/BANDAGES/DRESSINGS) ×2 IMPLANT
CANISTER SUCT 1200ML W/VALVE (MISCELLANEOUS) ×1 IMPLANT
CHLORAPREP W/TINT 26 (MISCELLANEOUS) ×2 IMPLANT
COVER BACK TABLE 60X90IN (DRAPES) ×1 IMPLANT
COVER MAYO STAND STRL (DRAPES) ×1 IMPLANT
DERMABOND ADVANCED .7 DNX12 (GAUZE/BANDAGES/DRESSINGS) ×2 IMPLANT
DRAIN CHANNEL 15F RND FF W/TCR (WOUND CARE) IMPLANT
DRAPE TOP ARMCOVERS (MISCELLANEOUS) ×1 IMPLANT
DRAPE U-SHAPE 76X120 STRL (DRAPES) ×1 IMPLANT
DRAPE UTILITY XL STRL (DRAPES) ×1 IMPLANT
ELECT COATED BLADE 2.86 ST (ELECTRODE) ×1 IMPLANT
ELECT REM PT RETURN 9FT ADLT (ELECTROSURGICAL) ×1
ELECTRODE REM PT RTRN 9FT ADLT (ELECTROSURGICAL) ×1 IMPLANT
EVACUATOR SILICONE 100CC (DRAIN) IMPLANT
GAUZE PAD ABD 8X10 STRL (GAUZE/BANDAGES/DRESSINGS) ×2 IMPLANT
GLOVE BIO SURGEON STRL SZ 6 (GLOVE) ×2 IMPLANT
GLOVE BIO SURGEON STRL SZ 6.5 (GLOVE) IMPLANT
GOWN STRL REUS W/ TWL LRG LVL3 (GOWN DISPOSABLE) ×2 IMPLANT
MARKER SKIN DUAL TIP RULER LAB (MISCELLANEOUS) IMPLANT
NDL HYPO 25X1 1.5 SAFETY (NEEDLE) ×1 IMPLANT
NEEDLE HYPO 25X1 1.5 SAFETY (NEEDLE) ×1
NS IRRIG 1000ML POUR BTL (IV SOLUTION) ×1 IMPLANT
PACK BASIN DAY SURGERY FS (CUSTOM PROCEDURE TRAY) ×1 IMPLANT
PENCIL SMOKE EVACUATOR (MISCELLANEOUS) ×1 IMPLANT
PIN SAFETY STERILE (MISCELLANEOUS) ×1 IMPLANT
SHEET MEDIUM DRAPE 40X70 STRL (DRAPES) ×1 IMPLANT
SLEEVE SCD COMPRESS KNEE MED (STOCKING) ×1 IMPLANT
SPONGE T-LAP 18X18 ~~LOC~~+RFID (SPONGE) ×3 IMPLANT
STAPLER SKIN PROX WIDE 3.9 (STAPLE) ×1 IMPLANT
SUT ETHILON 2 0 FS 18 (SUTURE) IMPLANT
SUT MNCRL AB 4-0 PS2 18 (SUTURE) IMPLANT
SUT VIC AB 3-0 PS1 18XBRD (SUTURE) IMPLANT
SUT VIC AB 4-0 PS2 18 (SUTURE) IMPLANT
SYR BULB IRRIG 60ML STRL (SYRINGE) ×1 IMPLANT
SYR CONTROL 10ML LL (SYRINGE) ×1 IMPLANT
TOWEL GREEN STERILE FF (TOWEL DISPOSABLE) ×2 IMPLANT
TUBE CONNECTING 20X1/4 (TUBING) ×1 IMPLANT
UNDERPAD 30X36 HEAVY ABSORB (UNDERPADS AND DIAPERS) ×2 IMPLANT
YANKAUER SUCT BULB TIP NO VENT (SUCTIONS) ×1 IMPLANT

## 2022-12-31 NOTE — Op Note (Signed)
Operative Note   DATE OF OPERATION: 11.26.2024  LOCATION: [facility]-outpatient  SURGICAL DIVISION: Plastic Surgery  PREOPERATIVE DIAGNOSES:  1. Macromastia 2. Chronic neck and back pain 3. Intertrigo  POSTOPERATIVE DIAGNOSES:  same  PROCEDURE:  Bilateral breast reduction  SURGEON: Glenna Fellows MD MBA  ASSISTANT: none  ANESTHESIA:  General.   EBL: 75 ml  COMPLICATIONS: None immediate.   INDICATIONS FOR PROCEDURE:  The patient, Madison Moss, is a 32 y.o. female born on 14-Mar-1990, is here for treatment chronic neck and back pain intertrigo in setting of macromastia that failed conservative measures.    FINDINGS: Right reduction 1217 g Left reduction 741 g  DESCRIPTION OF PROCEDURE:  The patient was marked standing in the preoperative area to mark sternal notch, chest midline, anterior axillary lines, inframammary folds. The location of new nipple areolar complex was marked at level of on inframammary fold on anterior surface breast by palpation. This was marked symmetric over bilateral breasts. With aid of Wise pattern marker, location of new nipple areolar complex and vertical limbs (7 cm) were marked by displacement of breasts along meridian. The patient was taken to the operating room. SCDs were placed and IV antibiotics were given. The patient's operative site was prepped and draped in a sterile fashion. A time out was performed and all information was confirmed to be correct.     I began on left breast. Over left breast, superior medial pedicle marked and nipple areolar complex incised with 45 mm diameter marker. Pedicle deepithlialized and developed to chest wall. Breast tissue resected over lower pole. Medial and lateral flaps developed. Additional superior and lateral breast tissue excised. Breast tailor tacked closed.    I then directed attention to right breast where superior medial pedicle designed. NAC incised with 45 mm diameter marker. The pedicle was  deepithelialized. Pedicle developed to chest wall. Breast tissue resected over lower pole. Medial and lateral flaps developed. Additional superior and lateral breast tissue excised. Breast tailor tacked closed. Patient assessed for symmetry. Breast cavities irrigated and hemostasis obtained. Local anesthetic infiltrated throughout each breast. 15 Fr JP placed in each breast and secured with 2-0 nylon. Closure completed bilateral with 3-0 vicryl to approximate dermis along inframammary fold and vertical limb. NAC inset with 3-0 vicryl in dermis. Skin closure completed with 4-0 monocryl subcuticular throughout. Tissue adhesive applied. Dry dressing and breast binder applied.  The patient was allowed to wake from anesthesia, extubated and taken to the recovery room in satisfactory condition.   SPECIMENS: right and left breast reduction  DRAINS: 15 Fr JP in right and left breast  Glenna Fellows, MD [facility] Plastic & Reconstructive Surgery  Office/ physician access line after hours [phone]

## 2022-12-31 NOTE — Anesthesia Postprocedure Evaluation (Signed)
Anesthesia Post Note  Patient: Madison Moss  Procedure(s) Performed: MAMMARY REDUCTION  (BREAST) (Bilateral: Breast)     Patient location during evaluation: PACU Anesthesia Type: General Level of consciousness: awake and alert Pain management: pain level controlled Vital Signs Assessment: post-procedure vital signs reviewed and stable Respiratory status: spontaneous breathing, nonlabored ventilation and respiratory function stable Cardiovascular status: blood pressure returned to baseline and stable Postop Assessment: no apparent nausea or vomiting Anesthetic complications: no   No notable events documented.  Last Vitals:  Vitals:   12/31/22 1330 12/31/22 1416  BP: (!) 132/95 124/78  Pulse: 87 88  Resp: 16 18  Temp:  (!) 36.2 C  SpO2: 100% 97%    Last Pain:  Vitals:   12/31/22 1416  TempSrc: Oral  PainSc: 4                  Traylen Eckels,W. EDMOND

## 2022-12-31 NOTE — Anesthesia Preprocedure Evaluation (Addendum)
Anesthesia Evaluation  Patient identified by MRN, date of birth, ID band Patient awake    Reviewed: Allergy & Precautions, H&P , NPO status , Patient's Chart, lab work & pertinent test results  Airway Mallampati: II  TM Distance: >3 FB Neck ROM: Full    Dental no notable dental hx. (+) Teeth Intact, Dental Advisory Given   Pulmonary neg pulmonary ROS   Pulmonary exam normal breath sounds clear to auscultation       Cardiovascular negative cardio ROS  Rhythm:Regular Rate:Normal     Neuro/Psych negative neurological ROS  negative psych ROS   GI/Hepatic negative GI ROS, Neg liver ROS,,,  Endo/Other  negative endocrine ROS    Renal/GU negative Renal ROS  negative genitourinary   Musculoskeletal   Abdominal   Peds  Hematology negative hematology ROS (+)   Anesthesia Other Findings   Reproductive/Obstetrics negative OB ROS                             Anesthesia Physical Anesthesia Plan  ASA: 2  Anesthesia Plan: General   Post-op Pain Management: Tylenol PO (pre-op)* and Toradol IV (intra-op)*   Induction: Intravenous  PONV Risk Score and Plan: 4 or greater and Ondansetron, Dexamethasone, Propofol infusion, TIVA and Midazolam  Airway Management Planned: Oral ETT  Additional Equipment:   Intra-op Plan:   Post-operative Plan: Extubation in OR  Informed Consent: I have reviewed the patients History and Physical, chart, labs and discussed the procedure including the risks, benefits and alternatives for the proposed anesthesia with the patient or authorized representative who has indicated his/her understanding and acceptance.     Dental advisory given  Plan Discussed with: CRNA  Anesthesia Plan Comments:        Anesthesia Quick Evaluation

## 2022-12-31 NOTE — Discharge Instructions (Addendum)
Post Anesthesia Home Care Instructions  Activity: Get plenty of rest for the remainder of the day. A responsible individual must stay with you for 24 hours following the procedure.  For the next 24 hours, DO NOT: -Drive a car -Advertising copywriter -Drink alcoholic beverages -Take any medication unless instructed by your physician -Make any legal decisions or sign important papers.  Meals: Start with liquid foods such as gelatin or soup. Progress to regular foods as tolerated. Avoid greasy, spicy, heavy foods. If nausea and/or vomiting occur, drink only clear liquids until the nausea and/or vomiting subsides. Call your physician if vomiting continues.  Special Instructions/Symptoms: Your throat may feel dry or sore from the anesthesia or the breathing tube placed in your throat during surgery. If this causes discomfort, gargle with warm salt water. The discomfort should disappear within 24 hours.  If you had a scopolamine patch placed behind your ear for the management of post- operative nausea and/or vomiting:  1. The medication in the patch is effective for 72 hours, after which it should be removed.  Wrap patch in a tissue and discard in the trash. Wash hands thoroughly with soap and water. 2. You may remove the patch earlier than 72 hours if you experience unpleasant side effects which may include dry mouth, dizziness or visual disturbances. 3. Avoid touching the patch. Wash your hands with soap and water after contact with the patch.    About my Jackson-Pratt Bulb Drain  What is a Jackson-Pratt bulb? A Jackson-Pratt is a soft, round device used to collect drainage. It is connected to a long, thin drainage catheter, which is held in place by one or two small stiches near your surgical incision site. When the bulb is squeezed, it forms a vacuum, forcing the drainage to empty into the bulb.  Emptying the Jackson-Pratt bulb- To empty the bulb: 1. Release the plug on the top of the  bulb. 2. Pour the bulb's contents into a measuring container which your nurse will provide. 3. Record the time emptied and amount of drainage. Empty the drain(s) as often as your     doctor or nurse recommends.  Date                  Time                    Amount (Drain 1)                 Amount (Drain 2)  _____________________________________________________________________  _____________________________________________________________________  _____________________________________________________________________  _____________________________________________________________________  _____________________________________________________________________  _____________________________________________________________________  _____________________________________________________________________  _____________________________________________________________________  Squeezing the Jackson-Pratt Bulb- To squeeze the bulb: 1. Make sure the plug at the top of the bulb is open. 2. Squeeze the bulb tightly in your fist. You will hear air squeezing from the bulb. 3. Replace the plug while the bulb is squeezed. 4. Use a safety pin to attach the bulb to your clothing. This will keep the catheter from     pulling at the bulb insertion site.  When to call your doctor- Call your doctor if: Drain site becomes red, swollen or hot. You have a fever greater than 101 degrees F. There is oozing at the drain site. Drain falls out (apply a guaze bandage over the drain hole and secure it with tape). Drainage increases daily not related to activity patterns. (You will usually have more drainage when you are active than when you are resting.) Drainage has a bad odor.  No tylenol or  ibuprofen until after 2:30pm today if needed.

## 2022-12-31 NOTE — Transfer of Care (Signed)
Immediate Anesthesia Transfer of Care Note  Patient: Madison Moss  Procedure(s) Performed: MAMMARY REDUCTION  (BREAST) (Bilateral: Breast)  Patient Location: PACU  Anesthesia Type:General  Level of Consciousness: awake and patient cooperative  Airway & Oxygen Therapy: Patient Spontanous Breathing and Patient connected to nasal cannula oxygen  Post-op Assessment: Report given to RN and Post -op Vital signs reviewed and stable  Post vital signs: Reviewed and stable  Last Vitals:  Vitals Value Taken Time  BP 128/80 12/31/22 1301  Temp 36.3 C 12/31/22 1300  Pulse 104 12/31/22 1304  Resp 30 12/31/22 1304  SpO2 100 % 12/31/22 1304  Vitals shown include unfiled device data.  Last Pain:  Vitals:   12/31/22 0829  TempSrc: Temporal  PainSc: 0-No pain      Patients Stated Pain Goal: 5 (12/31/22 0829)  Complications: No notable events documented.

## 2022-12-31 NOTE — Interval H&P Note (Signed)
History and Physical Interval Note:  12/31/2022 8:28 AM  Madison Moss  has presented today for surgery, with the diagnosis of macromastia chronic neck and back pain intertrigo.  The various methods of treatment have been discussed with the patient and family. After consideration of risks, benefits and other options for treatment, the patient has consented to  Procedure(s): MAMMARY REDUCTION  (BREAST) (Bilateral) as a surgical intervention.  The patient's history has been reviewed, patient examined, no change in status, stable for surgery.  I have reviewed the patient's chart and labs.  Questions were answered to the patient's satisfaction.     [name]

## 2022-12-31 NOTE — Anesthesia Procedure Notes (Signed)
Procedure Name: Intubation Date/Time: 12/31/2022 9:24 AM  Performed by: Earmon Phoenix, CRNAPre-anesthesia Checklist: Patient identified, Emergency Drugs available, Suction available, Patient being monitored and Timeout performed Patient Re-evaluated:Patient Re-evaluated prior to induction Oxygen Delivery Method: Circle system utilized Preoxygenation: Pre-oxygenation with 100% oxygen Induction Type: IV induction Ventilation: Mask ventilation without difficulty Laryngoscope Size: Mac and 4 Grade View: Grade I Tube type: Oral Tube size: 7.0 mm Number of attempts: 1 Airway Equipment and Method: Stylet Placement Confirmation: ETT inserted through vocal cords under direct vision, positive ETCO2 and breath sounds checked- equal and bilateral Secured at: 21 cm Tube secured with: Tape Dental Injury: Teeth and Oropharynx as per pre-operative assessment

## 2023-01-01 ENCOUNTER — Encounter (HOSPITAL_BASED_OUTPATIENT_CLINIC_OR_DEPARTMENT_OTHER): Payer: Self-pay | Admitting: Plastic Surgery

## 2023-01-01 LAB — SURGICAL PATHOLOGY

## 2023-12-03 ENCOUNTER — Other Ambulatory Visit: Payer: Self-pay

## 2023-12-03 ENCOUNTER — Ambulatory Visit
Admission: EM | Admit: 2023-12-03 | Discharge: 2023-12-03 | Disposition: A | Discharge status: Home / Self Care | Attending: Family Medicine | Admitting: Family Medicine

## 2023-12-03 DIAGNOSIS — J020 Streptococcal pharyngitis: Secondary | ICD-10-CM

## 2023-12-03 LAB — POCT INFLUENZA A/B
Influenza A, POC: NEGATIVE
Influenza B, POC: NEGATIVE

## 2023-12-03 LAB — POCT RAPID STREP A (OFFICE): Rapid Strep A Screen: POSITIVE — AB

## 2023-12-03 LAB — POC SOFIA SARS ANTIGEN FIA: SARS Coronavirus 2 Ag: NEGATIVE

## 2023-12-03 MED ORDER — FLUCONAZOLE 150 MG PO TABS
150.0000 mg | ORAL_TABLET | Freq: Every day | ORAL | 0 refills | Status: AC
Start: 2023-12-03 — End: ?

## 2023-12-03 MED ORDER — AMOXICILLIN 875 MG PO TABS: 875.0000 mg | ORAL_TABLET | Freq: Two times a day (BID) | ORAL | 0 refills | Status: AC

## 2023-12-03 NOTE — ED Provider Notes (Signed)
 [facility]    CSN: [id number] Arrival date & time: 12/03/23  1407      History   Chief Complaint No chief complaint on file.   HPI Madison Moss is a 33 y.o. female.   Patient has a severe sore throat and a headache for the last couple of days.  No runny nose or cough.  She is feeling more tired.  No known exposure to illness.  Generally states she is in good health.    Past Medical History:  Diagnosis Date   Macromastia     There are no active problems to display for this patient.   Past Surgical History:  Procedure Laterality Date   BREAST REDUCTION SURGERY Bilateral 12/31/2022   Procedure: MAMMARY REDUCTION  (BREAST);  Surgeon: Arelia Filippo, MD;  Location: [facility];  Service: Plastics;  Laterality: Bilateral;   COLPOSCOPY     NO PAST SURGERIES      OB History   No obstetric history on file.      Home Medications    Prior to Admission medications   Medication Sig Start Date End Date Taking? Authorizing Provider  amoxicillin (AMOXIL) 875 MG tablet Take 1 tablet (875 mg total) by mouth 2 (two) times daily for 10 days. 12/03/23 12/13/23 Yes Maranda Jamee Jacob, MD  fluconazole (DIFLUCAN) 150 MG tablet Take 1 tablet (150 mg total) by mouth daily. Repeat in 1 week if needed 12/03/23  Yes Maranda Jamee Jacob, MD    Family History History reviewed. No pertinent family history.  Social History Social History   Tobacco Use   Smoking status: Never   Smokeless tobacco: Never  Vaping Use   Vaping status: Never Used  Substance Use Topics   Alcohol use: Not Currently    Comment: occasional   Drug use: No     Allergies   Patient has no known allergies.   Review of Systems Review of Systems   Physical Exam Triage Vital Signs ED Triage Vitals  Encounter Vitals Group     BP 12/03/23 1417 (!) 133/95     Girls Systolic BP Percentile --      Girls Diastolic BP Percentile --      Boys Systolic BP Percentile --      Boys  Diastolic BP Percentile --      Pulse Rate 12/03/23 1417 98     Resp 12/03/23 1417 16     Temp 12/03/23 1417 99.1 F (37.3 C)     Temp src --      SpO2 12/03/23 1417 96 %     Weight --      Height --      Head Circumference --      Peak Flow --      Pain Score 12/03/23 1420 8     Pain Loc --      Pain Education --      Exclude from Growth Chart --    No data found.  Updated Vital Signs BP (!) 133/95   Pulse 98   Temp 99.1 F (37.3 C)   Resp 16   LMP 10/27/2023 (Exact Date)   SpO2 96%      Physical Exam Constitutional:      General: She is not in acute distress.    Appearance: She is well-developed.  HENT:     Head: Normocephalic and atraumatic.     Mouth/Throat:     Pharynx: Oropharyngeal exudate and posterior oropharyngeal erythema present.  Eyes:  Conjunctiva/sclera: Conjunctivae normal.     Pupils: Pupils are equal, round, and reactive to light.  Cardiovascular:     Rate and Rhythm: Normal rate.  Pulmonary:     Effort: Pulmonary effort is normal. No respiratory distress.  Abdominal:     General: There is no distension.     Palpations: Abdomen is soft.  Musculoskeletal:        General: Normal range of motion.     Cervical back: Normal range of motion.  Lymphadenopathy:     Cervical: Cervical adenopathy present.  Skin:    General: Skin is warm and dry.  Neurological:     Mental Status: She is alert.      UC Treatments / Results  Labs (all labs ordered are listed, but only abnormal results are displayed) Labs Reviewed  POCT RAPID STREP A (OFFICE) - Abnormal; Notable for the following components:      Result Value   Rapid Strep A Screen Positive (*)    All other components within normal limits  POC SOFIA SARS ANTIGEN FIA - Normal  POCT INFLUENZA A/B - Normal    EKG   Radiology No results found.  Procedures Procedures (including critical care time)  Medications Ordered in UC Medications - No data to display  Initial Impression /  Assessment and Plan / UC Course  I have reviewed the triage vital signs and the nursing notes.  Pertinent labs & imaging results that were available during my care of the patient were reviewed by me and considered in my medical decision making (see chart for details).     Strep test is positive.  COVID is negative.  Patient is advised to take 10 full days of antibiotics. Final Clinical Impressions(s) / UC Diagnoses   Final diagnoses:  Streptococcal sore throat     Discharge Instructions      Take the amoxicillin 2 x a day for 10 full days May take ibuprofen or tylenol  for pain I have prescribed diflucan in case it is needed Call for problems   ED Prescriptions     Medication Sig Dispense Auth. Provider   amoxicillin (AMOXIL) 875 MG tablet Take 1 tablet (875 mg total) by mouth 2 (two) times daily for 10 days. 20 tablet Maranda Jamee Jacob, MD   fluconazole (DIFLUCAN) 150 MG tablet Take 1 tablet (150 mg total) by mouth daily. Repeat in 1 week if needed 2 tablet Maranda Jamee Jacob, MD      PDMP not reviewed this encounter.   Maranda Jamee Jacob, MD 12/03/23 [phone]

## 2023-12-03 NOTE — ED Triage Notes (Addendum)
 Sick x 2 days, has had fever, sore throat, ears feel full and painful. Face hurts and has had mild cough. Fever has been up to 103-102. Has had chills as well. Has had tylenol  cold and flu, theraflu.

## 2023-12-03 NOTE — Discharge Instructions (Signed)
 Take the amoxicillin 2 x a day for 10 full days May take ibuprofen or tylenol  for pain I have prescribed diflucan in case it is needed Call for problems
